# Patient Record
Sex: Male | Born: 1960 | Race: White | Hispanic: No | Marital: Married | Smoking: Never smoker
Health system: Southern US, Community
[De-identification: ages and names within clinical notes are randomized; demographics above are authoritative.]

## PROBLEM LIST (undated history)

## (undated) DIAGNOSIS — K859 Acute pancreatitis without necrosis or infection, unspecified: Secondary | ICD-10-CM

## (undated) HISTORY — DX: Acute pancreatitis without necrosis or infection, unspecified: K85.90

---

## 2016-08-14 DIAGNOSIS — I10 Essential (primary) hypertension: Secondary | ICD-10-CM | POA: Diagnosis not present

## 2016-08-14 DIAGNOSIS — M109 Gout, unspecified: Secondary | ICD-10-CM | POA: Diagnosis not present

## 2016-09-13 DIAGNOSIS — I10 Essential (primary) hypertension: Secondary | ICD-10-CM | POA: Diagnosis not present

## 2016-09-13 DIAGNOSIS — M1A9XX0 Chronic gout, unspecified, without tophus (tophi): Secondary | ICD-10-CM | POA: Diagnosis not present

## 2016-12-19 DIAGNOSIS — I10 Essential (primary) hypertension: Secondary | ICD-10-CM | POA: Diagnosis not present

## 2016-12-19 DIAGNOSIS — Z23 Encounter for immunization: Secondary | ICD-10-CM | POA: Diagnosis not present

## 2017-03-20 DIAGNOSIS — I10 Essential (primary) hypertension: Secondary | ICD-10-CM | POA: Diagnosis not present

## 2017-03-20 DIAGNOSIS — Z23 Encounter for immunization: Secondary | ICD-10-CM | POA: Diagnosis not present

## 2017-03-20 DIAGNOSIS — E785 Hyperlipidemia, unspecified: Secondary | ICD-10-CM | POA: Diagnosis not present

## 2017-06-25 DIAGNOSIS — I10 Essential (primary) hypertension: Secondary | ICD-10-CM | POA: Diagnosis not present

## 2017-06-25 DIAGNOSIS — E785 Hyperlipidemia, unspecified: Secondary | ICD-10-CM | POA: Diagnosis not present

## 2017-09-02 DIAGNOSIS — R21 Rash and other nonspecific skin eruption: Secondary | ICD-10-CM | POA: Diagnosis not present

## 2017-09-24 DIAGNOSIS — E785 Hyperlipidemia, unspecified: Secondary | ICD-10-CM | POA: Diagnosis not present

## 2017-09-24 DIAGNOSIS — M109 Gout, unspecified: Secondary | ICD-10-CM | POA: Diagnosis not present

## 2017-09-24 DIAGNOSIS — I10 Essential (primary) hypertension: Secondary | ICD-10-CM | POA: Diagnosis not present

## 2017-12-25 DIAGNOSIS — M109 Gout, unspecified: Secondary | ICD-10-CM | POA: Diagnosis not present

## 2017-12-25 DIAGNOSIS — E785 Hyperlipidemia, unspecified: Secondary | ICD-10-CM | POA: Diagnosis not present

## 2017-12-25 DIAGNOSIS — I10 Essential (primary) hypertension: Secondary | ICD-10-CM | POA: Diagnosis not present

## 2021-02-06 ENCOUNTER — Encounter: Payer: Self-pay | Admitting: Internal Medicine

## 2021-02-06 ENCOUNTER — Ambulatory Visit: Payer: No Typology Code available for payment source | Admitting: Internal Medicine

## 2021-02-06 VITALS — BP 120/80 | HR 59 | Ht 70.5 in | Wt 180.0 lb

## 2021-02-06 DIAGNOSIS — R935 Abnormal findings on diagnostic imaging of other abdominal regions, including retroperitoneum: Secondary | ICD-10-CM

## 2021-02-06 DIAGNOSIS — K863 Pseudocyst of pancreas: Secondary | ICD-10-CM | POA: Diagnosis not present

## 2021-02-06 DIAGNOSIS — K859 Acute pancreatitis without necrosis or infection, unspecified: Secondary | ICD-10-CM

## 2021-02-06 DIAGNOSIS — K8020 Calculus of gallbladder without cholecystitis without obstruction: Secondary | ICD-10-CM

## 2021-02-06 NOTE — Patient Instructions (Signed)
If you are age 59 or older, your body mass index should be between 23-30. Your Body mass index is 25.46 kg/m. If this is out of the aforementioned range listed, please consider follow up with your Primary Care Provider.  If you are age 30 or younger, your body mass index should be between 19-25. Your Body mass index is 25.46 kg/m. If this is out of the aformentioned range listed, please consider follow up with your Primary Care Provider.   ________________________________________________________  The [facility] providers would like to encourage you to use [facility] to communicate with providers for non-urgent requests or questions.  Due to long hold times on the telephone, sending your provider a message by [facility] may be a faster and more efficient way to get a response.  Please allow 48 business hours for a response.  Please remember that this is for non-urgent requests.  _______________________________________________________  Bonita Quin have been scheduled for an MRI/MRCP at [facility] on 03/08/2021. Your appointment time is 9:00am. Please arrive to admitting (at main entrance of the hospital) 30 minutes prior to your appointment time for registration purposes. Please make certain not to have anything to eat or drink 4 hours prior to your test. In addition, if you have any metal in your body, have a pacemaker or defibrillator, please be sure to let your ordering physician know. This test typically takes 45 minutes to 1 hour to complete. Should you need to reschedule, please call [phone] to do so.

## 2021-02-06 NOTE — Progress Notes (Signed)
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 Marvis Moeller, regarding pancreatic pseudocyst.  The patient is accompanied by his wife.  Patient reports 3-year history of intermittent problems with abdominal pain consistent with biliary colic.  On December 14, 2020, while working in [place], the patient developed severe persistent epigastric pain followed by nausea and vomiting.  He presented to [facility] and was found to have acute pancreatitis with lipase level greater than 15,000.  Liver test revealed mild elevation of transaminases.  Normal alkaline phosphatase and bilirubin.  Admission hemoglobin 16.8.  Discharge hemoglobin 12.9.  Admission BUN 29.  Discharge BUN 17.  CT scan on admission revealed changes of acute pancreatitis.  Gallstones noted.    Follow-up CT scan December 19, 2020: CT Abdomen and Pelvis With IV Contrast  Result Date: 12/19/2020 IMPRESSION: 1. Diffuse heterogeneous enhancement of the pancreas on the current exam, with the suggestion of more focal ill-defined regions of hypoenhancement in the region of the pancreatic neck and body, as well as the anterior aspect of the pancreatic head, suspicious for possible regions of pancreatic parenchymal necrosis. There remains extensive peripancreatic fat stranding/edema and fluid, consistent with the patient's history of acute pancreatitis. There appears to be a small more focal oblong loculated fluid collection extending along the undersurface of the gastric wall on the current exam, suspicious for the possibility of a developing pseudocyst or abscess. There also appears to be relatively increased additional partially loculated fluid in the lesser sac anterior to the pancreatic head and neck on the current exam. 2. Relatively increased free fluid in the abdomen and pelvis, including a moderate volume of increased free fluid in the pelvis, as noted above. In  addition, there is also mild diffuse mesenteric edema and subcutaneous edema/anasarca, which is relatively increased compared to the previous exam. 3. New small bilateral pleural effusions, with patchy regions of consolidation throughout the lung bases, with associated air bronchograms, indeterminate for atelectasis versus airspace disease/infection. 4. Persistent large rim calcified gallstone is again noted in the lumen of the gallbladder, as noted above. There appears to be relatively increased thickening and enhancement of the gallbladder wall on the current exam, with adjacent pericholecystic fat stranding/edema. While its possible this may be reactive in etiology, related to the patient's aforementioned acute pancreatitis, the possibility of superimposed acute or chronic cholecystitis cannot be excluded on this examination. Clinical correlation is recommended. 5. Diffuse thickening of the esophageal wall on the current exam, which is nonspecific, possibly related to a component of underlying acute esophagitis. Dedicated upper endoscopy may be useful for further evaluation, as clinically warranted. 6. Persistent left inguinal hernia, containing fat and a small portion of the sigmoid colon, as noted above  The patient was discharged home December 20, 2020.  He is accompanied today by his wife.  During his hospitalization he did have atrial fibrillation.  He was treated medically.  This resolved.  Since his hospital discharge he has been feeling fine.  No abdominal pain.  No vomiting.  No fevers.  He was evaluated by Dr. Lequita Halt for the prospects of cholecystectomy.  Office evaluation December 11, 2020.  Reviewed.  He was subsequently set up for a CT scan of the abdomen and pelvis.  He was noted to have gallstones.  No biliary dilation.  There was a 7.3 cm bilobed cystic process adjacent to the anterior aspect of the pancreatic body.  There was ductal dilation  in the head without discrete mass.  At the  request of his workplace, he is asked me to fill out short-term disability.  REVIEW OF SYSTEMS:  All non-GI ROS negative entirely.  Past Medical History:  Diagnosis Date   Pancreatitis     History reviewed. No pertinent surgical history.  Social History [name]  reports that he has never smoked. He has never used smokeless tobacco. He reports that he does not drink alcohol and does not use drugs.  Family History  Heart disease  No Known Allergies     PHYSICAL EXAMINATION: Vital signs: BP 120/80   Pulse (!) 59   Ht 5' 10.5" (1.791 m)   Wt 180 lb (81.6 kg)   BMI 25.46 kg/m   Constitutional: generally well-appearing, no acute distress Psychiatric: alert and oriented x3, cooperative Eyes: extraocular movements intact, anicteric, conjunctiva pink Mouth: oral pharynx moist, no lesions Neck: supple no lymphadenopathy Cardiovascular: heart regular rate and rhythm, no murmur Lungs: clear to auscultation bilaterally Abdomen: soft, nontender, nondistended, no obvious ascites, no peritoneal signs, normal bowel sounds, no organomegaly Rectal: Omitted Extremities: no clubbing, cyanosis, or lower extremity edema bilaterally Skin: no lesions on visible extremities Neuro: No focal deficits.  Cranial nerves intact  ASSESSMENT:  1.  Acute pancreatitis.  Hospitalized in [facility] December 14, 2020 through December 20, 2020.  Suspect biliary in origin. 2.  Cholelithiasis. 3.  Minimal elevation of transaminases during hospitalization with normal alkaline phosphatase and bilirubin.  No ductal dilation on imaging. 4.  Organizing fluid collection.  Asymptomatic.  Patient may have had an element of necrosis based on previous imaging. 5.  General medical problems.  Stable   PLAN:  1.  At this point, I would follow his organizing fluid collection as he is currently asymptomatic.  We will plan on MRI/MRCP in 1 month.  At that point, hopefully all inflammatory changes will have resolved.   We should be able to evaluate the bile duct and pancreatic duct.  We can assess for mass.  As well, reassess the fluid collection.  May or may not need EUS. 2.  Laparoscopic cholecystectomy.  Timing to be determined 3.  Continue with low-fat diet 4.  Contact the office in the interim for any problems 5.  Filling out his short-term disability papers as requested.  A total time of 60+ minutes was spent preparing to see the patient, reviewing outside hospitalization, laboratories, and multiple x-rays.  Obtaining comprehensive history, performing comprehensive physical examination, counseling the patient and his wife regarding his condition and the plans.  Ordering advanced imaging studies, filling out disability papers, and documenting clinical information in the health record

## 2021-02-12 ENCOUNTER — Telehealth: Payer: Self-pay | Admitting: Internal Medicine

## 2021-02-12 NOTE — Telephone Encounter (Signed)
Inbound call from Patient, wanting to follow up on FMLA paperwork.

## 2021-02-13 NOTE — Telephone Encounter (Signed)
Lm on vm that Dr. Marina Goodell had filled out his part and signed the paperwork and that I faxed it and was awaiting a response

## 2021-03-06 NOTE — Telephone Encounter (Signed)
Awaiting response

## 2021-03-08 ENCOUNTER — Other Ambulatory Visit: Payer: Self-pay

## 2021-03-08 ENCOUNTER — Ambulatory Visit (HOSPITAL_COMMUNITY)
Admission: RE | Admit: 2021-03-08 | Discharge: 2021-03-08 | Disposition: A | Discharge status: Home / Self Care | Payer: PRIVATE HEALTH INSURANCE | Source: Ambulatory Visit | Attending: Internal Medicine | Admitting: Internal Medicine

## 2021-03-08 ENCOUNTER — Other Ambulatory Visit: Payer: Self-pay | Admitting: Internal Medicine

## 2021-03-08 ENCOUNTER — Ambulatory Visit (HOSPITAL_COMMUNITY): Payer: No Typology Code available for payment source

## 2021-03-08 DIAGNOSIS — K859 Acute pancreatitis without necrosis or infection, unspecified: Secondary | ICD-10-CM | POA: Insufficient documentation

## 2021-03-08 IMAGING — MR MR ABDOMEN WO/W CM MRCP
16 of 20 series · 36 of 48 positions shown · IV contrast (gadavist)
Comparison: No prior abdominal MRI. CT of the abdomen and pelvis
[DATE].

CLINICAL DATA: 60-year-old male with history of acute pancreatitis.
Evaluate for pancreatic pseudocyst.

EXAM:
MRI ABDOMEN WITHOUT AND WITH CONTRAST (INCLUDING MRCP)
TECHNIQUE: Multiplanar multisequence MR imaging of the abdomen was performed
both before and after the administration of intravenous contrast.
Heavily T2-weighted images of the biliary and pancreatic ducts were
obtained, and three-dimensional MRCP images were rendered by post
processing.
CONTRAST:  8mL GADAVIST GADOBUTROL 1 MMOL/ML IV SOLN

[Series 3: T2 fat-sat · axial · 6.0mm · 1.25mm/px · 1 of 36 slices shown]
[im 1/36]
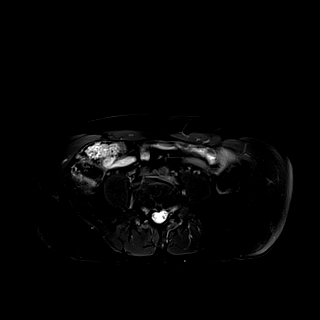

[Series 5: DWI · axial · 6.0mm · 1.49mm/px · z∈[-63,+189]mm · 2 of 72 slices shown (1 of 2)]
[im 1/72]
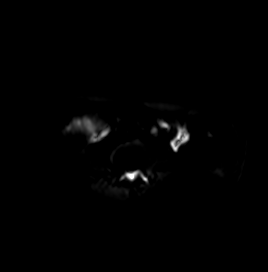
[im 72/72]
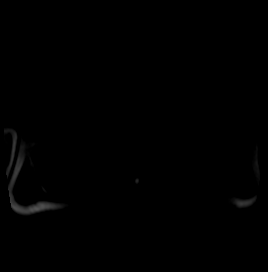

[Series 6: DWI · axial · 6.0mm · 1.49mm/px · 1 of 36 slices shown (2 of 2)]
[im 1/36]
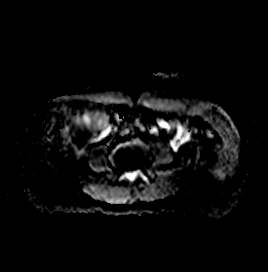

[Series 8: cor_3d_spc_trig · coronal · 1.0mm · 0.49mm/px · 3 of 88 slices shown]
[im 1/88]
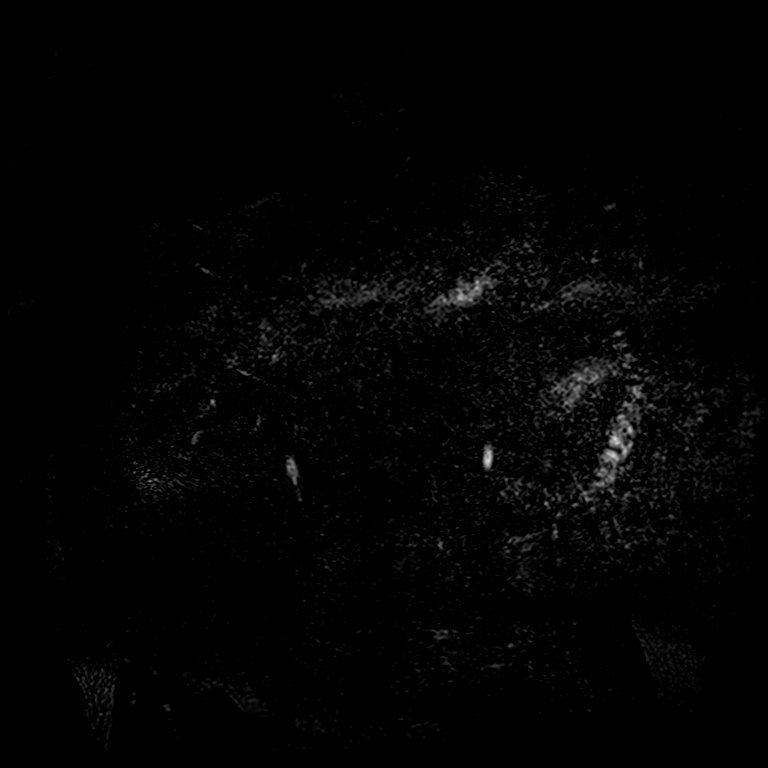
[im 44/88]
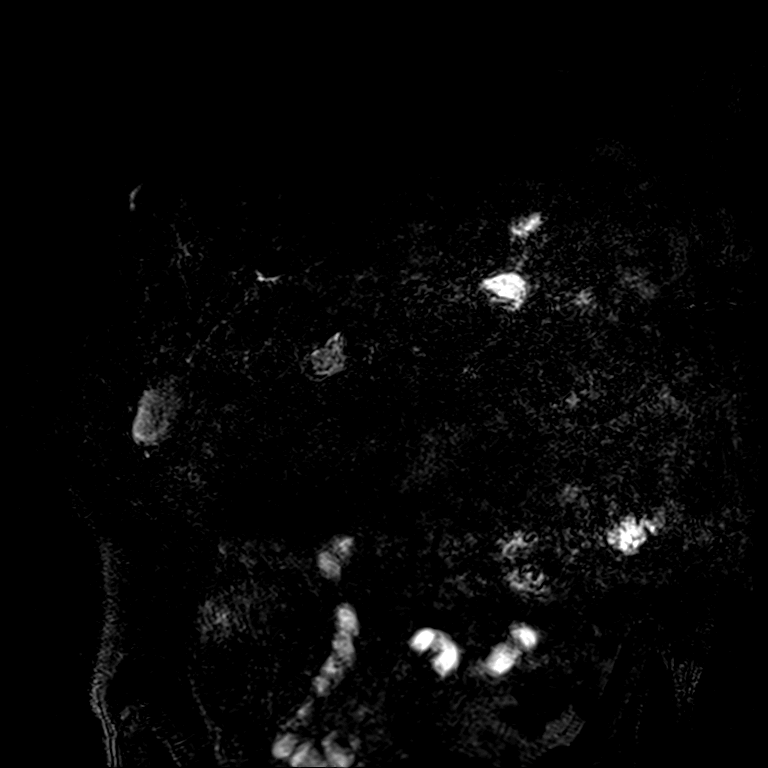
[im 88/88]
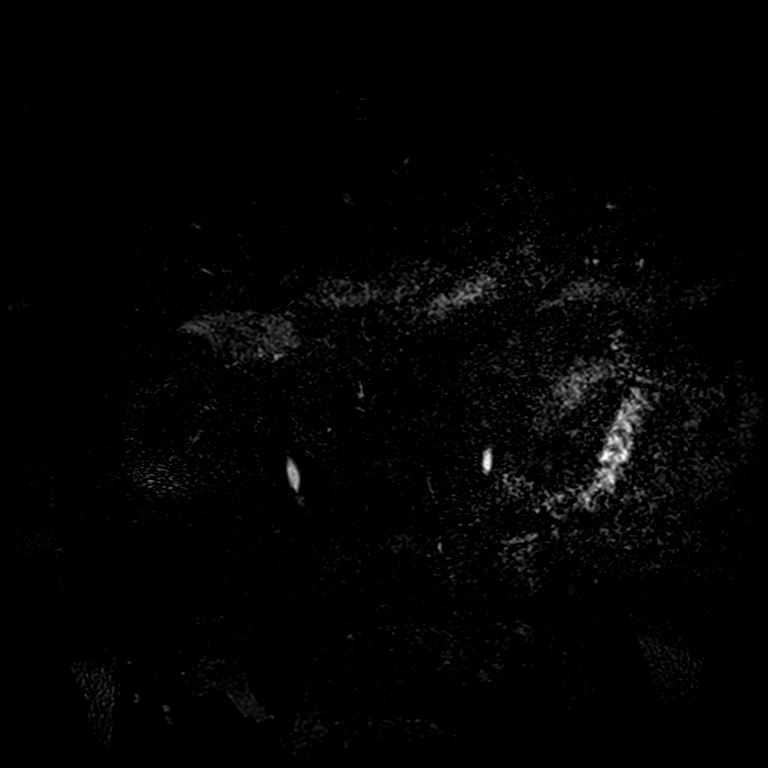

[Series 10: T2 · coronal · 6.0mm · 1.52mm/px · 1 of 28 slices shown (1 of 2)]
[im 1/28]
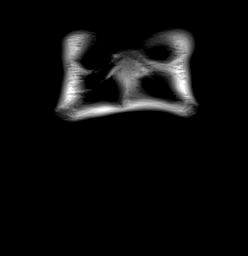

[Series 11: T1 · axial · 3.0mm · 1.25mm/px · z∈[-50,+211]mm · 3 of 88 slices shown (1 of 2)]
[im 1/88]
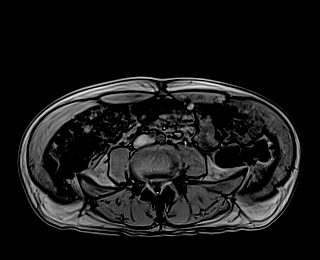
[im 44/88]
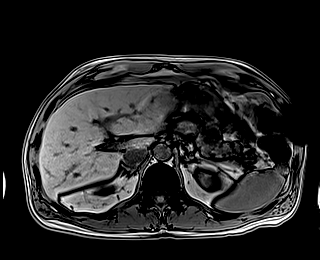
[im 88/88]
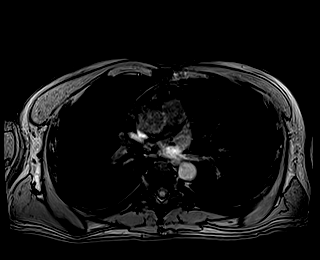

[Series 12: T1 · axial · 3.0mm · 1.25mm/px · z∈[-50,+211]mm · 3 of 88 slices shown (2 of 2)]
[im 1/88]
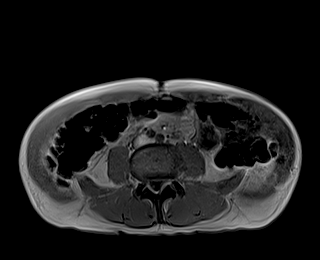
[im 44/88]
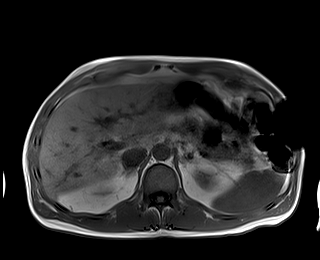
[im 88/88]
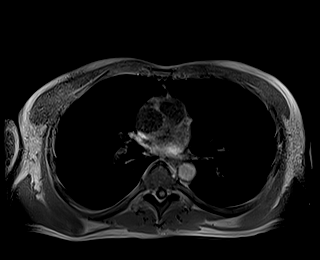

[Series 14: cor obl thk · sagittal · 50.0mm · 0.78mm/px · 1 of 9 slices shown]
[im 1/9]
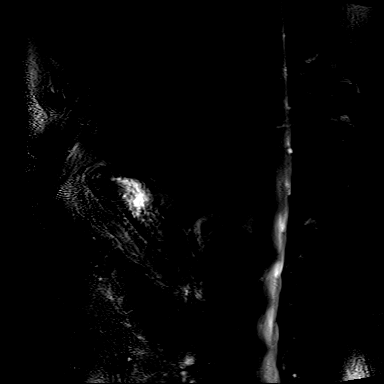

[Series 15: T2 · axial · 6.0mm · 1.56mm/px · 1 of 34 slices shown (2 of 2)]
[im 1/34]
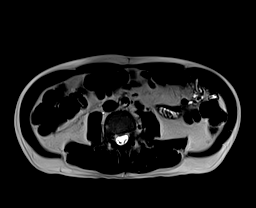

[Series 17: T1 dynamic · axial · 3.0mm · 1.25mm/px · z∈[-25,+212]mm · 3 of 80 slices shown (1 of 7)]
[im 1/80]
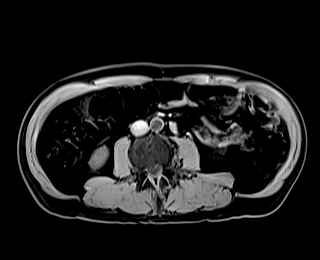
[im 40/80]
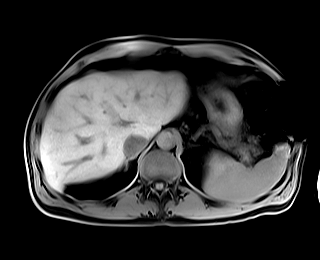
[im 80/80]
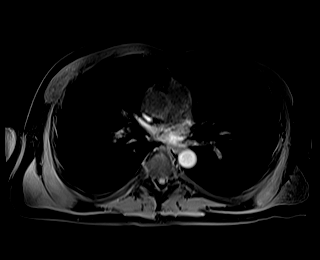

[Series 21: T1 dynamic · axial · 3.0mm · 1.25mm/px · z∈[-25,+212]mm · 3 of 80 slices shown (2 of 7)]
[im 1/80]
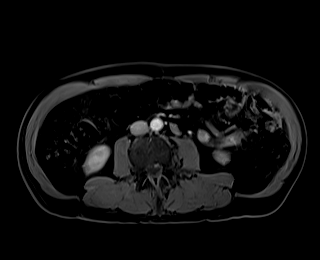
[im 40/80]
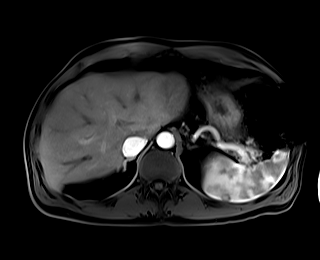
[im 80/80]
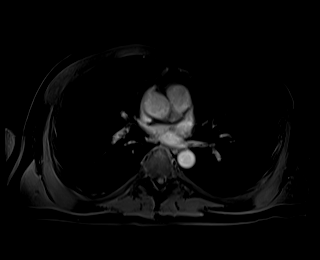

[Series 22: T1 dynamic · axial · 3.0mm · 1.25mm/px · z∈[-25,+212]mm · 3 of 80 slices shown (3 of 7)]
[im 1/80]
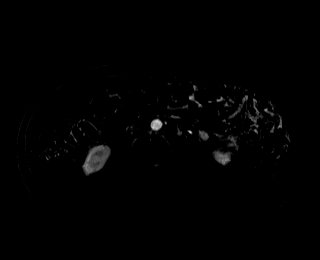
[im 40/80]
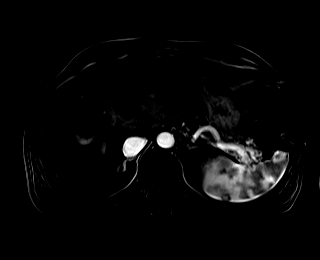
[im 80/80]
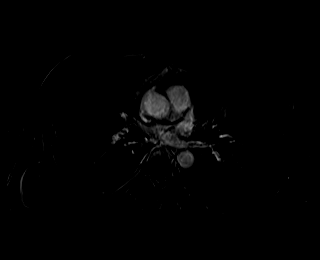

[Series 25: T1 dynamic · axial · 3.0mm · 1.25mm/px · z∈[-25,+212]mm · 3 of 80 slices shown (4 of 7)]
[im 1/80]
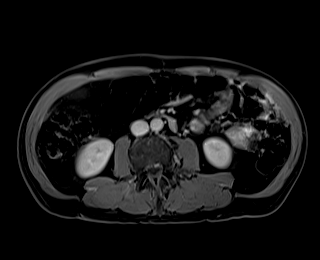
[im 40/80]
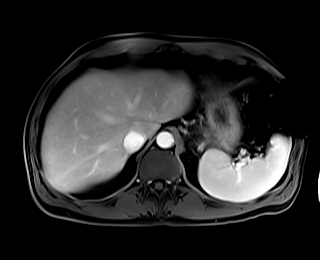
[im 80/80]
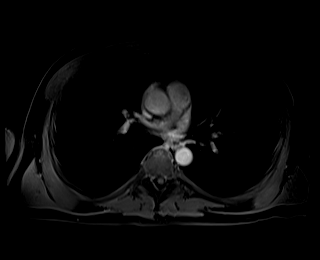

[Series 26: T1 dynamic · axial · 3.0mm · 1.25mm/px · z∈[-25,+212]mm · 3 of 80 slices shown (5 of 7)]
[im 1/80]
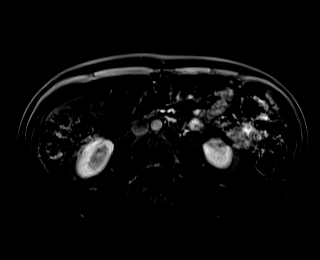
[im 40/80]
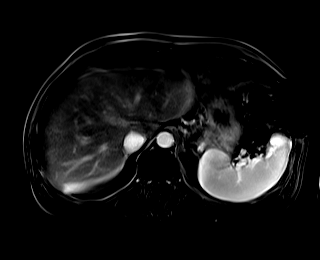
[im 80/80]
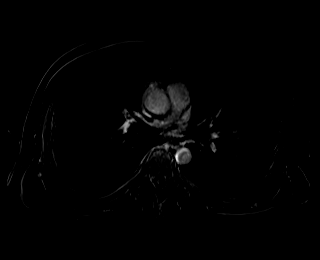

[Series 29: T1 dynamic · axial · 3.0mm · 1.25mm/px · z∈[-25,+212]mm · 3 of 80 slices shown (6 of 7)]
[im 1/80]
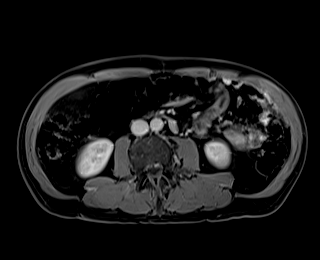
[im 40/80]
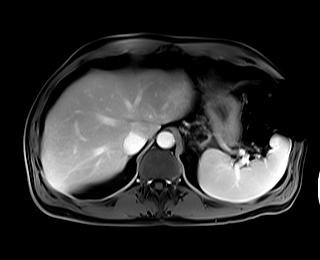
[im 80/80]
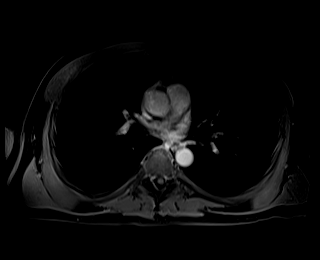

[Series 30: T1 dynamic · axial · 3.0mm · 1.25mm/px · z∈[-25,+92]mm · 2 of 80 slices shown (7 of 7)]
[im 1/80]
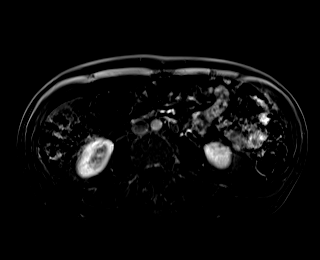
[im 40/80]
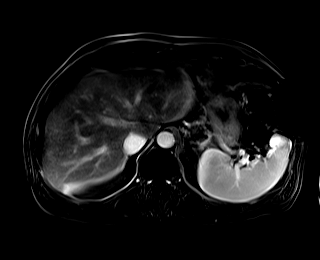

[36 of 48 positions shown; findings below may reference images not displayed]

FINDINGS: Lower chest: Unremarkable.

Hepatobiliary: No suspicious cystic or solid hepatic lesions. No
intra or extrahepatic biliary ductal dilatation noted on MRCP
images. Common bile duct is 4 mm in the porta hepatis. There are no
filling defects within the common bile duct to suggest the presence
of choledocholithiasis. Large filling defect within the gallbladder,
compatible with a gallstone measuring up to 4.7 cm in diameter. This
is non dependent, and appear to contain some gas on the prior CT the
abdomen and pelvis.

Pancreas: In the body of the pancreas there is a large complex
collection which is irregular in shape and therefore difficult to
accurately measure, but estimated to measure approximately 2.6 x
x 2.2 cm (axial image 49 of series 21 and coronal image 13 of series
10). This lesion is heterogeneous in signal intensity on T1 and T2
weighted images, but generally T1 hypointense and T2 hyperintense,
with peripheral rim of enhancement but no definite internal
enhancing soft tissue. No definite solid enhancing pancreatic mass
noted. No peripancreatic inflammatory changes identified at this
time. Pancreatic parenchyma appears to enhance normally.

Spleen: Spleen is prominent and borderline measuring 12.5 x 4.5 x
9.7 cm (estimated splenic volume of 272 mL).

Adrenals/Urinary Tract: Bilateral kidneys and adrenal glands are
normal in appearance. No hydroureteronephrosis in the visualized
portions of the abdomen.

Stomach/Bowel: Visualized portions are unremarkable.

Vascular/Lymphatic: No aneurysm identified in the visualized
abdominal vasculature. No lymphadenopathy noted in the abdomen.

Other: No significant volume of ascites noted in the visualized
portions of the peritoneal cavity.

Musculoskeletal: No aggressive appearing osseous lesions are noted
in the visualized portions of the skeleton.
IMPRESSION: 1. Slight involution of chronic pancreatic pseudocyst in the body of
the pancreas, as above.
2. Large gas containing gallstone within the gallbladder. No
findings to suggest an acute cholecystitis are noted at this time.
3. No choledocholithiasis or findings of biliary tract obstruction.

## 2021-03-08 MED ORDER — GADOBUTROL 1 MMOL/ML IV SOLN
8.0000 mL | Freq: Once | INTRAVENOUS | Status: AC | PRN
  Administered 2021-03-08: 8 mL via INTRAVENOUS

## 2021-03-12 ENCOUNTER — Telehealth: Payer: Self-pay | Admitting: Internal Medicine

## 2021-03-12 NOTE — Telephone Encounter (Signed)
Returned patient call. See result note from 03/09/21 MRI report.

## 2021-03-12 NOTE — Telephone Encounter (Signed)
Patient returned your call regarding the results of his recent MRI.  Please call him back.  Thank you.

## 2021-04-18 ENCOUNTER — Ambulatory Visit: Payer: No Typology Code available for payment source | Admitting: Internal Medicine

## 2021-04-18 ENCOUNTER — Encounter: Payer: Self-pay | Admitting: Internal Medicine

## 2021-04-18 VITALS — BP 120/80 | HR 68 | Ht 71.0 in | Wt 185.1 lb

## 2021-04-18 DIAGNOSIS — K859 Acute pancreatitis without necrosis or infection, unspecified: Secondary | ICD-10-CM

## 2021-04-18 DIAGNOSIS — R935 Abnormal findings on diagnostic imaging of other abdominal regions, including retroperitoneum: Secondary | ICD-10-CM | POA: Diagnosis not present

## 2021-04-18 DIAGNOSIS — K8020 Calculus of gallbladder without cholecystitis without obstruction: Secondary | ICD-10-CM | POA: Diagnosis not present

## 2021-04-18 DIAGNOSIS — K863 Pseudocyst of pancreas: Secondary | ICD-10-CM

## 2021-04-18 NOTE — Patient Instructions (Signed)
If you are age 59 or older, your body mass index should be between 23-30. Your Body mass index is 25.82 kg/m. If this is out of the aforementioned range listed, please consider follow up with your Primary Care Provider.  If you are age 17 or younger, your body mass index should be between 19-25. Your Body mass index is 25.82 kg/m. If this is out of the aformentioned range listed, please consider follow up with your Primary Care Provider.   ________________________________________________________  The [facility] providers would like to encourage you to use [facility] to communicate with providers for non-urgent requests or questions.  Due to long hold times on the telephone, sending your provider a message by [facility] may be a faster and more efficient way to get a response.  Please allow 48 business hours for a response.  Please remember that this is for non-urgent requests.  _______________________________________________________  David Schaefer will be contacted by [facility] Scheduling in the next 2 days to arrange an MRI of the abdomen.  The number on your caller ID will be [phone], please answer when they call.  If you have not heard from them in 2 days please call [phone] to schedule.

## 2021-04-18 NOTE — Progress Notes (Signed)
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  He was sent regarding mild elevation of liver test, organizing fluid collection in the pancreas (asymptomatic), and concerns over possible choledocholithiasis prior to consideration of cholecystectomy.  See that dictation for details.  An MRI of the abdomen/MRCP was subsequently performed March 08, 2021.  There was no evidence for biliary ductal dilation or choledocholithiasis.  Cholelithiasis noted.  In the body of the pancreas a pseudocyst measuring 2.6 x 6.0 x 2.2 cm was noted no active inflammatory changes.  Pancreatic parenchyma appeared otherwise normal.  He was cleared for cholecystectomy which was performed March 23, 2021.  I cannot find an operative report.  The patient reports that this was unremarkable.  He did have some postoperative discomfort at the operative sites which continues to improve.  He has returned to work.  He is eating normally.  He denies abdominal pain or other GI complaints.  He has gained 5 pounds since his last visit.  REVIEW OF SYSTEMS:  All non-GI ROS negative. Past Medical History:  Diagnosis Date   Pancreatitis     Past Surgical History:  Procedure Laterality Date   CHOLECYSTECTOMY     TRACHELECTOMY      Social History David Schaefer  reports that he has never smoked. He has never used smokeless tobacco. He reports that he does not drink alcohol and does not use drugs.  family history is not on file.  No Known Allergies     PHYSICAL EXAMINATION: Vital signs: BP 120/80    Pulse 68    Ht 5\' 11"  (1.803 m)    Wt 185 lb 2 oz (84 kg)    BMI 25.82 kg/m   Constitutional: generally well-appearing, no acute distress Psychiatric: alert and oriented x3, cooperative Eyes:  extraocular movements intact, anicteric, conjunctiva pink, poor dentition Mouth: oral pharynx moist, no lesions Neck: supple no lymphadenopathy Cardiovascular: heart regular rate and rhythm, no murmur Lungs: clear to auscultation bilaterally Abdomen: soft, nontender, nondistended, no obvious ascites, no peritoneal signs, normal bowel sounds, no organomegaly Rectal: Omitted Extremities: no clubbing, cyanosis, or lower extremity edema bilaterally Skin: no lesions on visible extremities Neuro: No focal deficits.  Cranial nerves intact  ASSESSMENT:  1.  Acute pancreatitis, presumed biliary, for which he was hospitalized in for 1 week September 2022 2.  Pancreatic pseudocyst.  Stable.  Asymptomatic 3.  Status postcholecystectomy March 23, 2021   PLAN:  1.  Low-fat diet 2.  Schedule repeat MRI of the pancreas in 4 weeks to reassess the pseudocyst 3.  Assume he is doing well, routine office follow-up 6 months. A total time of 30 minutes was spent preparing to see the patient, reviewing test, obtaining history, performing physical examination, counseling the patient regarding the above listed issues, ordering advanced radiology, arranging clinical follow-up, and documenting clinical information in the health record

## 2021-04-25 ENCOUNTER — Ambulatory Visit (HOSPITAL_COMMUNITY): Payer: No Typology Code available for payment source

## 2021-04-28 ENCOUNTER — Ambulatory Visit (HOSPITAL_COMMUNITY)
Admission: RE | Admit: 2021-04-28 | Discharge: 2021-04-28 | Disposition: A | Discharge status: Home / Self Care | Payer: PRIVATE HEALTH INSURANCE | Source: Ambulatory Visit | Attending: Internal Medicine | Admitting: Internal Medicine

## 2021-04-28 ENCOUNTER — Other Ambulatory Visit: Payer: Self-pay | Admitting: Internal Medicine

## 2021-04-28 DIAGNOSIS — K863 Pseudocyst of pancreas: Secondary | ICD-10-CM

## 2021-04-28 IMAGING — MR MR 3D RECON AT SCANNER
1 series · 9 of 16 positions shown · IV contrast (gadavist)
Comparison: MRI [DATE] and CT [DATE]

CLINICAL DATA: Follow-up pancreatic pseudocyst

EXAM:
MRI ABDOMEN WITHOUT AND WITH CONTRAST
TECHNIQUE: Multiplanar multisequence MR imaging of the abdomen was performed
both before and after the administration of intravenous contrast.
CONTRAST:  8mL GADAVIST GADOBUTROL 1 MMOL/ML IV SOLN

[Series 100: <mip range> · sagittal · 0.49mm/px · 9 of 16 slices shown]
[im 2/16]
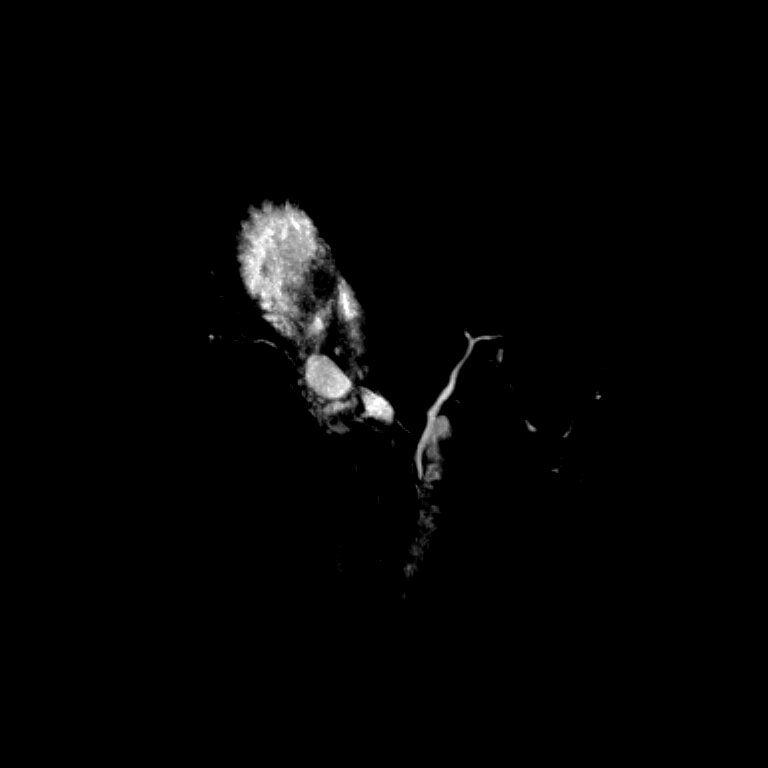
[im 3/16]
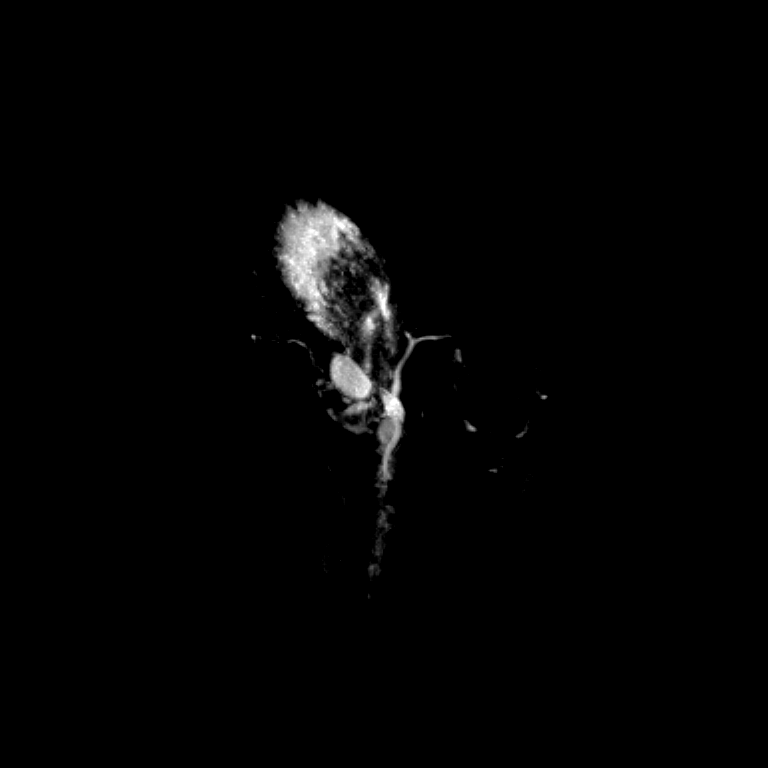
[im 4/16]
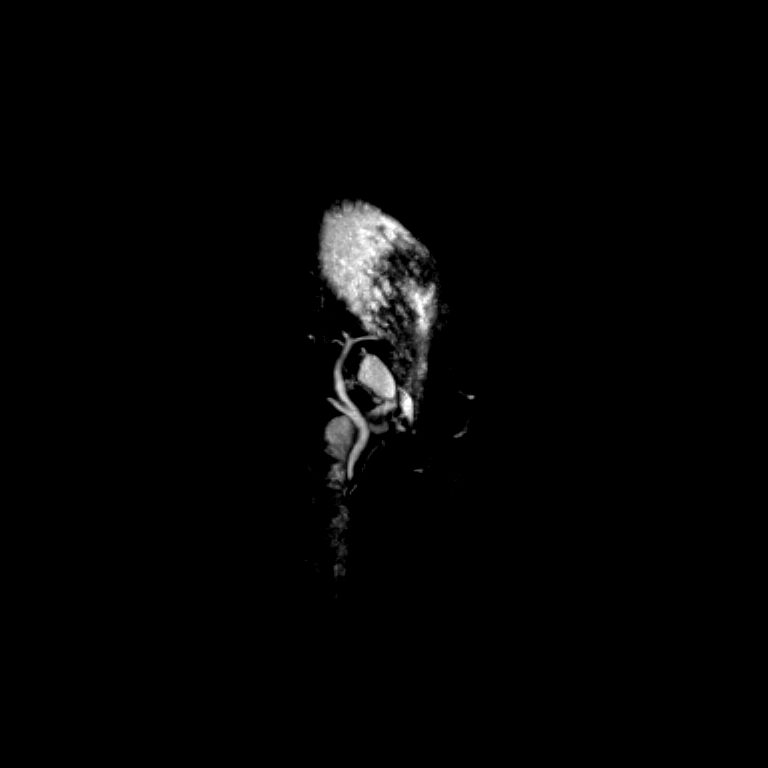
[im 6/16]
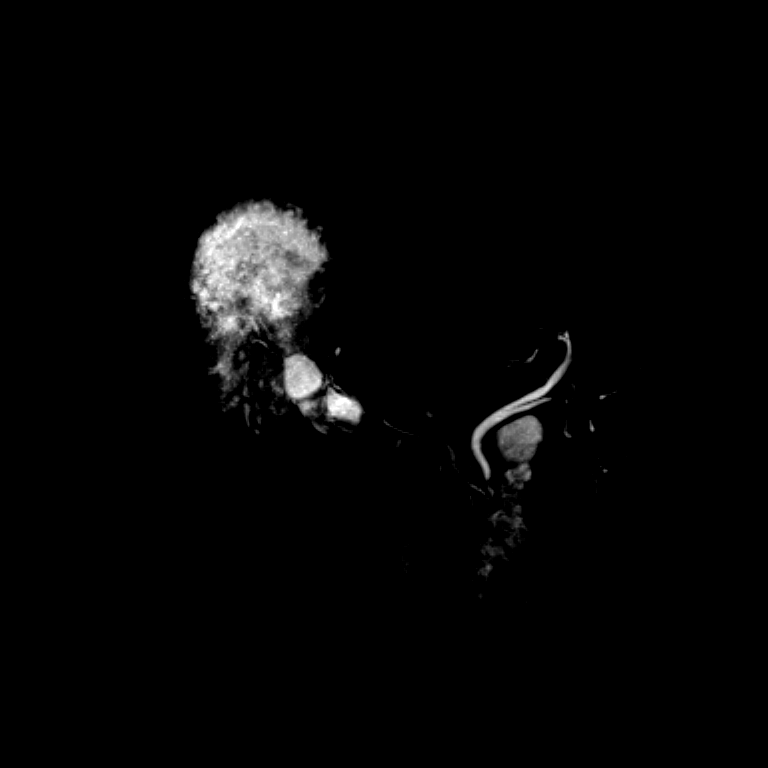
[im 8/16]
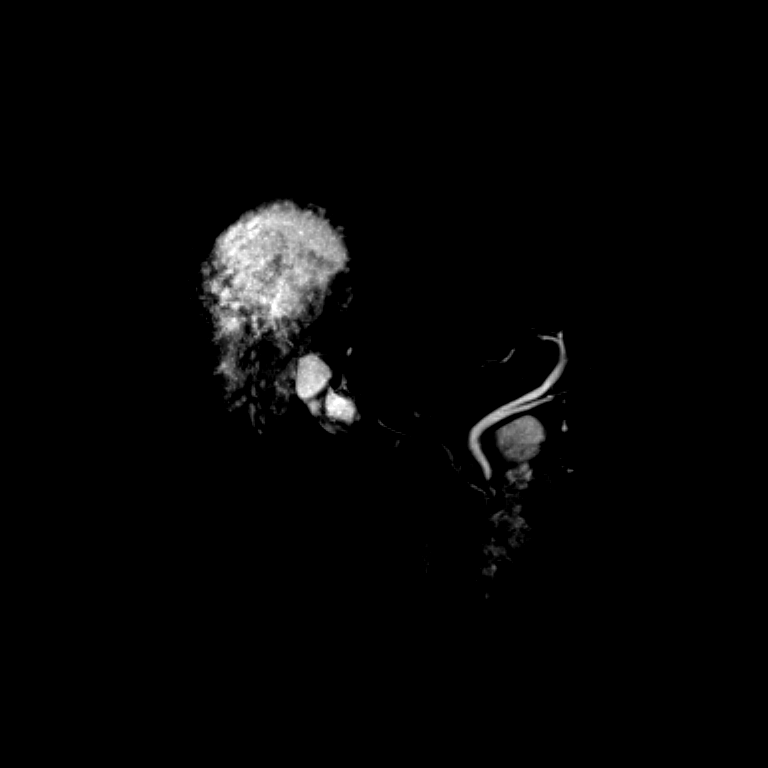
[im 9/16]
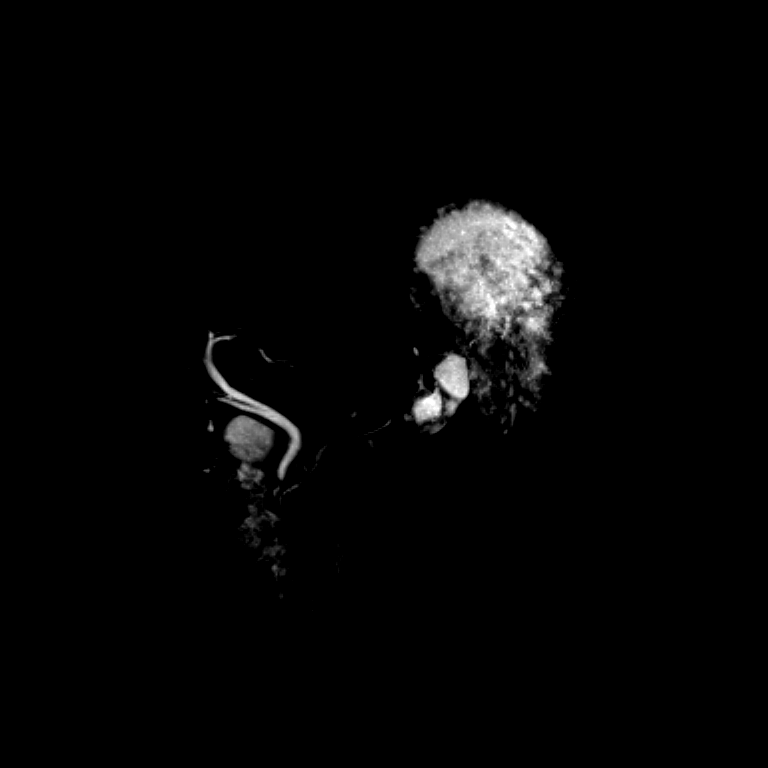
[im 10/16]
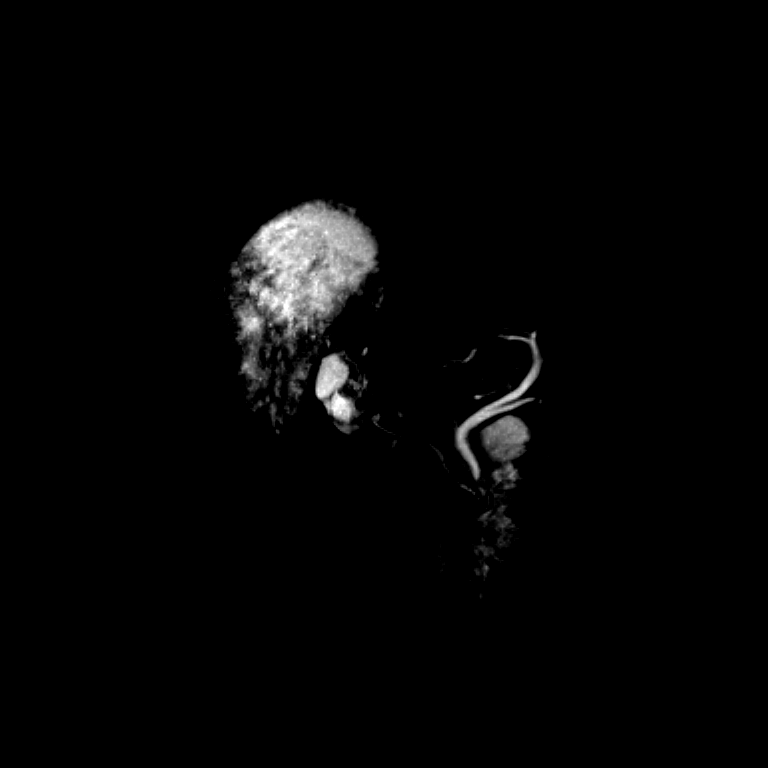
[im 12/16]
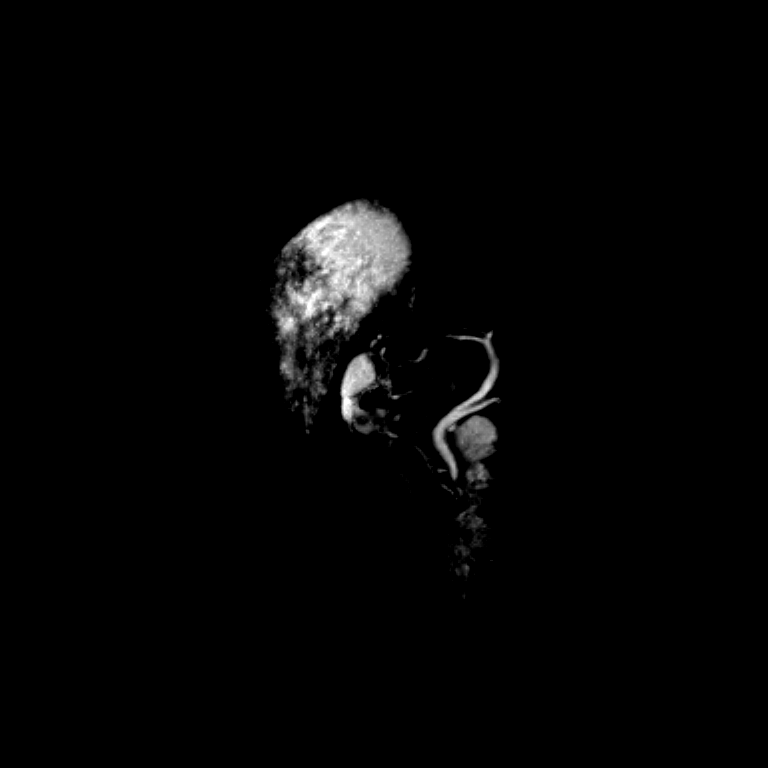
[im 14/16]
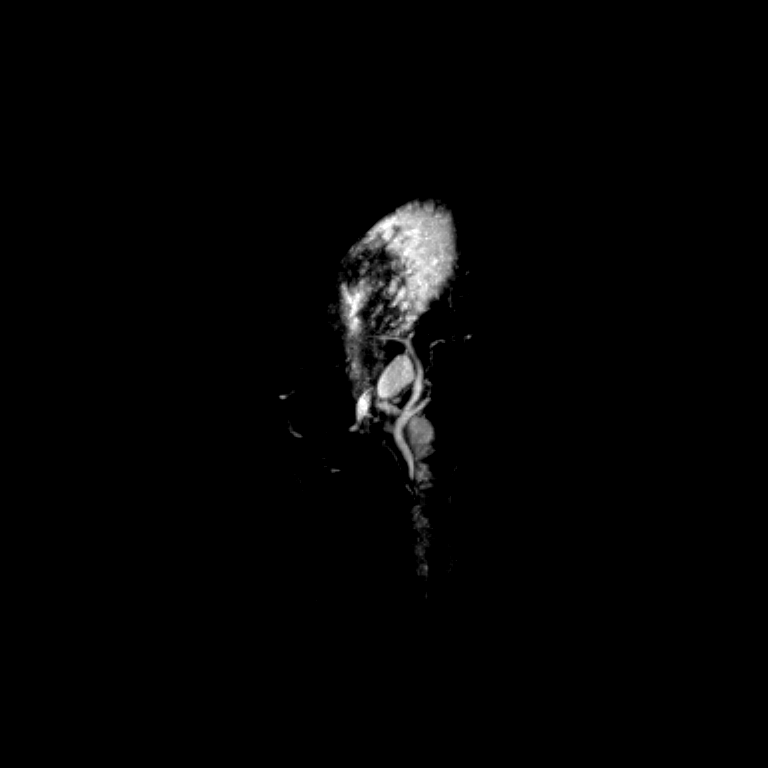

[9 of 16 positions shown; findings below may reference images not displayed]

FINDINGS: Lower chest: No acute findings.

Hepatobiliary: No hepatic steatosis. No suspicious hepatic lesion.
Gallbladder surgically absent. No biliary ductal dilation.

Pancreas: Irregularity shaped lobular large complex collection along
the anterior body of the pancreas is again difficult to accurately
measure but estimated to measure 4.9 x 2.1 x 1.6 cm on images [DATE]
and [DATE] previously 6.0 x 2.6 x 2.2 cm. This collection is once
again is predominantly T2 hyperintense and T1 hypointense without
suspicious postcontrast enhancement. Otherwise, homogeneous
enhancement of the pancreatic parenchyma. No pancreatic ductal
dilation. No evidence of acute inflammation.

Spleen: Similar borderline splenomegaly measuring 12.5 cm in maximum
dimension.

Adrenals/Urinary Tract: Bilateral adrenal glands appear normal. No
hydronephrosis. No solid enhancing renal mass.

Stomach/Bowel: Visualized portions within the abdomen are
unremarkable.

Vascular/Lymphatic: No pathologically enlarged lymph nodes
identified. No abdominal aortic aneurysm demonstrated.

Other:  None.

Musculoskeletal: No suspicious bone lesions identified.
IMPRESSION: 1. Decreased size of the complex pancreatic collection, which is not
demonstrate suspicious MRI features and is again favored to reflect
a chronic pancreatic pseudocyst. Recommend follow up pre and post
contrast MRI/MRCP 6 months versus fluid sampling by EUS/FNA. This
recommendation follows ACR consensus guidelines: Management of
Incidental Pancreatic Cysts: A White Paper of the ACR Incidental
Findings Committee. [HOSPITAL] [XI];[DATE].
2. No acute findings in the abdomen.

## 2021-04-28 IMAGING — MR MR ABDOMEN WO/W CM
16 of 21 series · 38 of 48 positions shown · IV contrast (gadavist)
Comparison: MRI [DATE] and CT [DATE]

CLINICAL DATA: Follow-up pancreatic pseudocyst

EXAM:
MRI ABDOMEN WITHOUT AND WITH CONTRAST
TECHNIQUE: Multiplanar multisequence MR imaging of the abdomen was performed
both before and after the administration of intravenous contrast.
CONTRAST:  8mL GADAVIST GADOBUTROL 1 MMOL/ML IV SOLN

[Series 2: DWI · axial · 6.0mm · 1.49mm/px · z∈[-121,+145]mm · 4 of 76 slices shown (1 of 2)]
[im 1/76]
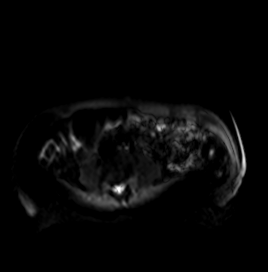
[im 26/76]
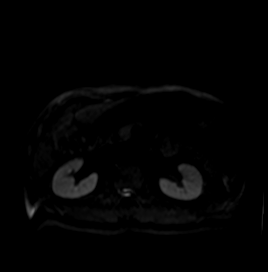
[im 51/76]
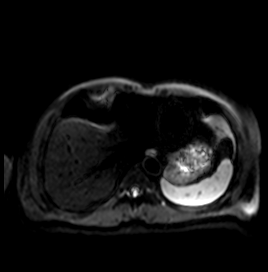
[im 76/76]
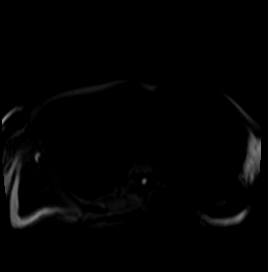

[Series 3: DWI · axial · 6.0mm · 1.49mm/px · 1 of 38 slices shown (2 of 2)]
[im 1/38]
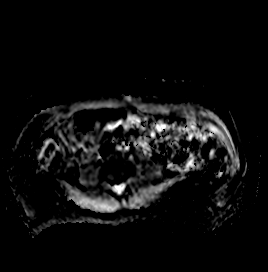

[Series 4: T2 fat-sat · axial · 6.0mm · 1.25mm/px · 1 of 39 slices shown]
[im 1/39]
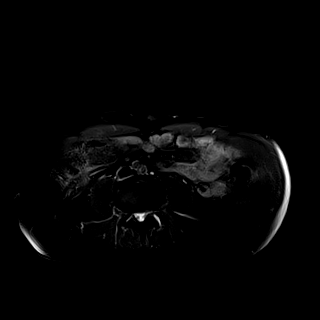

[Series 7: cor_3d_spc_trig · coronal · 1.0mm · 0.49mm/px · 3 of 72 slices shown]
[im 1/72]
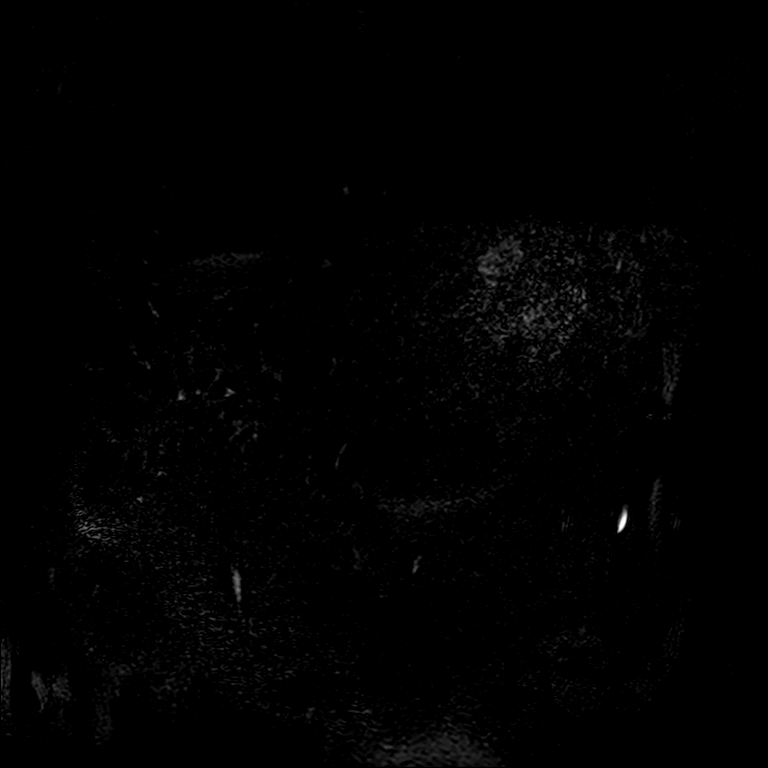
[im 36/72]
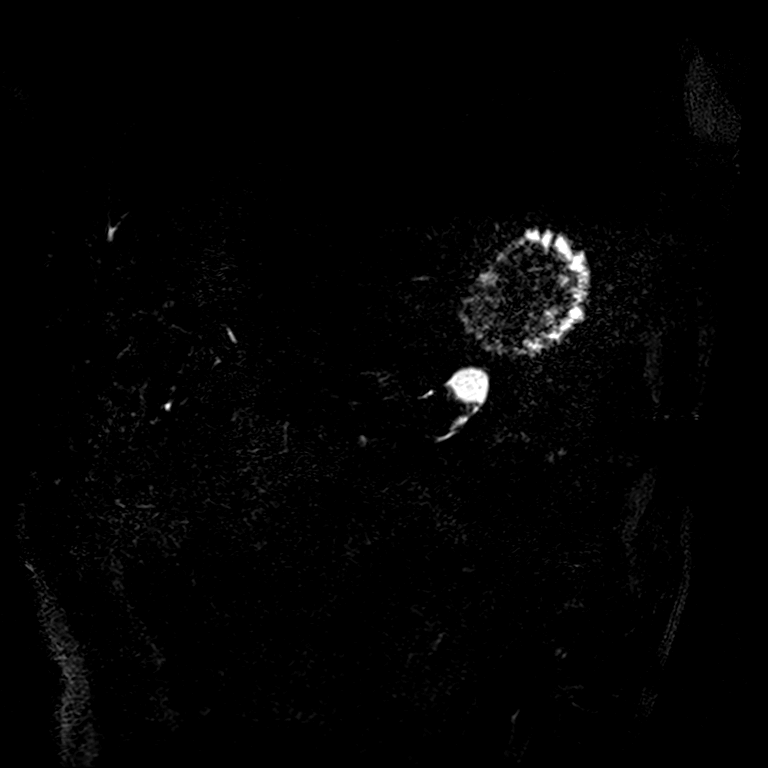
[im 72/72]
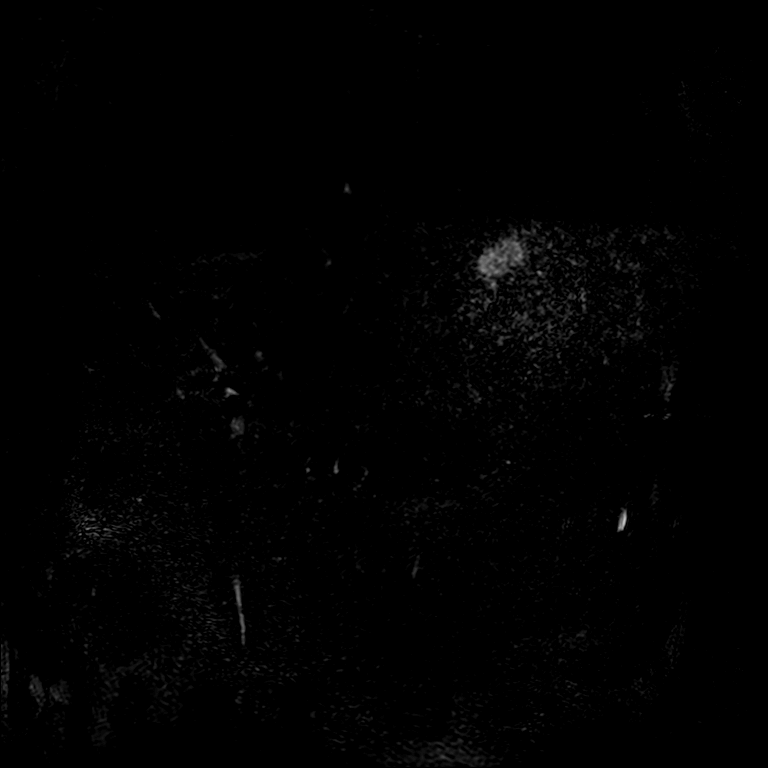

[Series 11: T2 · coronal · 7.0mm · 1.56mm/px · 1 of 37 slices shown]
[im 1/37]
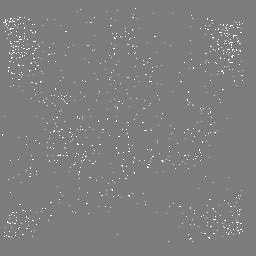

[Series 12: cor obl thk · sagittal · 50.0mm · 0.78mm/px · 1 of 9 slices shown]
[im 1/9]
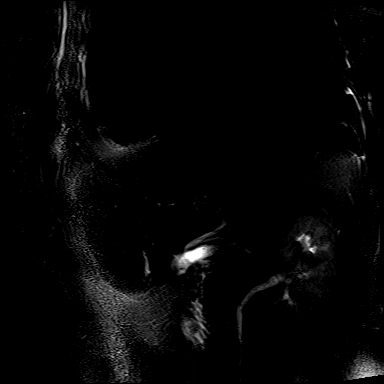

[Series 14: T1 · axial · 3.1mm · 1.25mm/px · z∈[-141,+129]mm · 3 of 88 slices shown (1 of 2)]
[im 1/88]
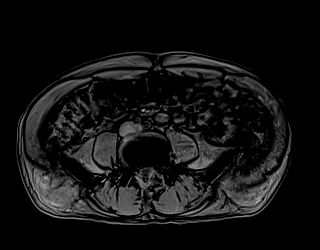
[im 44/88]
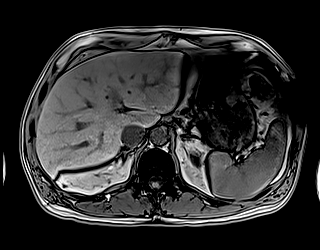
[im 88/88]
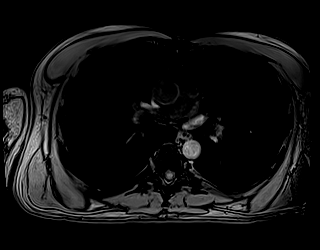

[Series 15: T1 · axial · 3.1mm · 1.25mm/px · z∈[-141,+129]mm · 3 of 88 slices shown (2 of 2)]
[im 1/88]
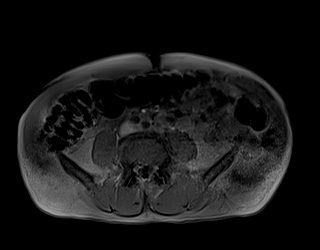
[im 44/88]
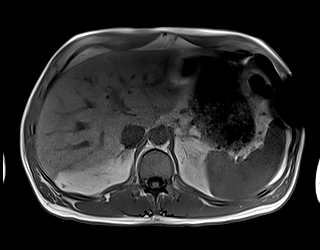
[im 88/88]
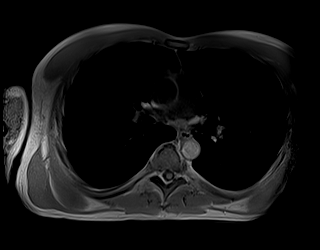

[Series 16: bSSFP · axial · 7.0mm · 1.25mm/px · 1 of 34 slices shown]
[im 1/34]
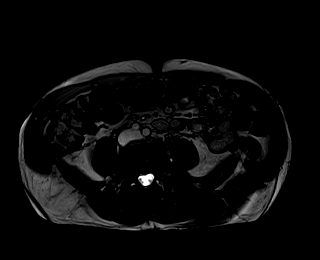

[Series 18: T1 dynamic · axial · 3.0mm · 1.25mm/px · z∈[-133,+128]mm · 3 of 88 slices shown (1 of 7)]
[im 1/88]
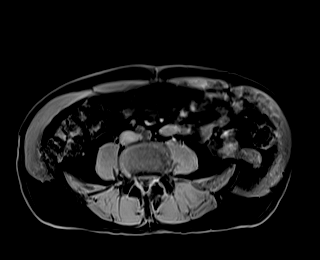
[im 44/88]
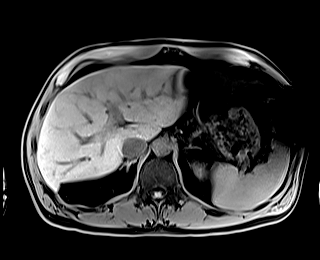
[im 88/88]
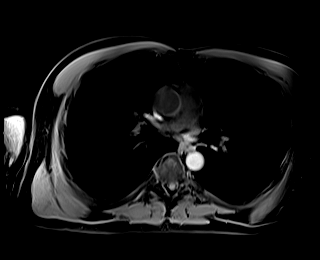

[Series 22: T1 dynamic · axial · 3.0mm · 1.25mm/px · z∈[-133,+128]mm · 3 of 88 slices shown (2 of 7)]
[im 1/88]
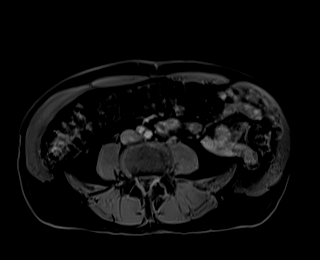
[im 44/88]
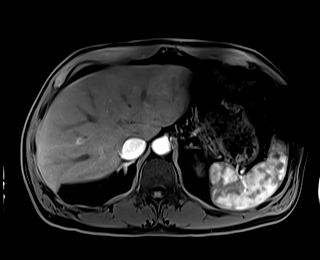
[im 88/88]
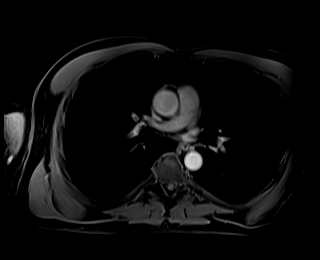

[Series 24: T1 dynamic · axial · 3.0mm · 1.25mm/px · z∈[-133,+128]mm · 3 of 88 slices shown (3 of 7)]
[im 1/88]
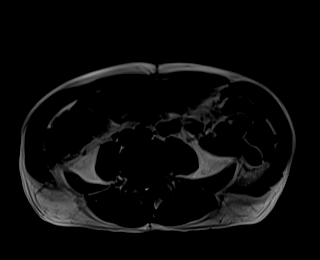
[im 44/88]
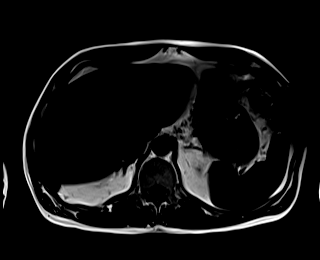
[im 88/88]
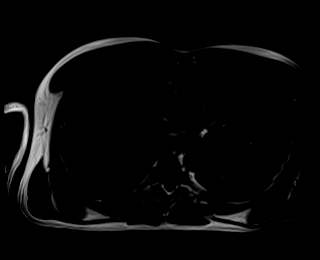

[Series 27: T1 dynamic · axial · 3.0mm · 1.25mm/px · z∈[-133,+128]mm · 3 of 88 slices shown (4 of 7)]
[im 1/88]
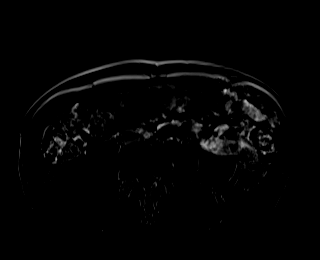
[im 44/88]
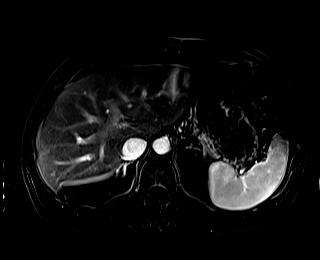
[im 88/88]
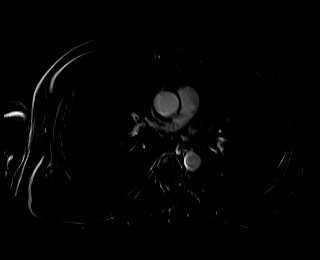

[Series 30: T1 dynamic · axial · 3.0mm · 1.25mm/px · z∈[-133,+128]mm · 3 of 88 slices shown (5 of 7)]
[im 1/88]
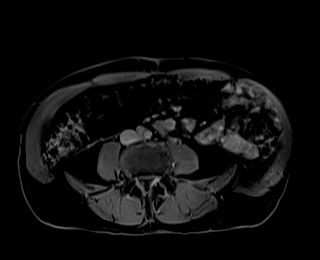
[im 44/88]
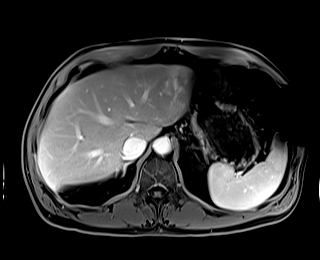
[im 88/88]
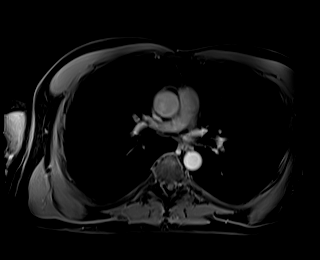

[Series 31: T1 dynamic · axial · 3.0mm · 1.25mm/px · z∈[-133,+128]mm · 3 of 88 slices shown (6 of 7)]
[im 1/88]
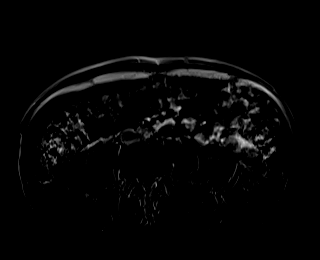
[im 44/88]
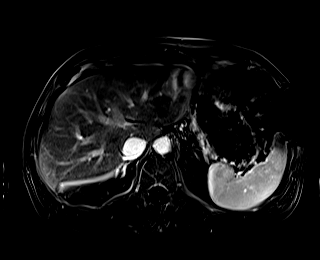
[im 88/88]
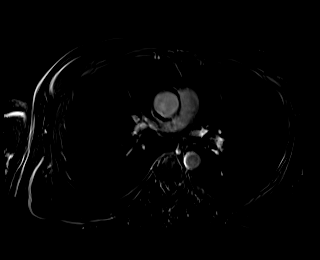

[Series 33: T1 dynamic · coronal · 5.0mm · 1.41mm/px · 2 of 52 slices shown (7 of 7)]
[im 1/52]
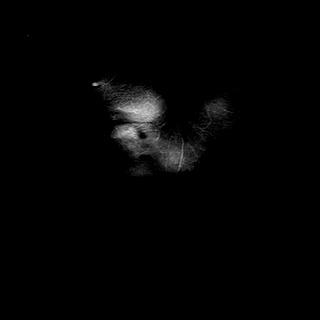
[im 52/52]
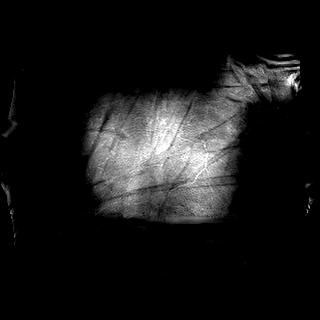

[38 of 48 positions shown; findings below may reference images not displayed]

FINDINGS: Lower chest: No acute findings.

Hepatobiliary: No hepatic steatosis. No suspicious hepatic lesion.
Gallbladder surgically absent. No biliary ductal dilation.

Pancreas: Irregularity shaped lobular large complex collection along
the anterior body of the pancreas is again difficult to accurately
measure but estimated to measure 4.9 x 2.1 x 1.6 cm on images [DATE]
and [DATE] previously 6.0 x 2.6 x 2.2 cm. This collection is once
again is predominantly T2 hyperintense and T1 hypointense without
suspicious postcontrast enhancement. Otherwise, homogeneous
enhancement of the pancreatic parenchyma. No pancreatic ductal
dilation. No evidence of acute inflammation.

Spleen: Similar borderline splenomegaly measuring 12.5 cm in maximum
dimension.

Adrenals/Urinary Tract: Bilateral adrenal glands appear normal. No
hydronephrosis. No solid enhancing renal mass.

Stomach/Bowel: Visualized portions within the abdomen are
unremarkable.

Vascular/Lymphatic: No pathologically enlarged lymph nodes
identified. No abdominal aortic aneurysm demonstrated.

Other:  None.

Musculoskeletal: No suspicious bone lesions identified.
IMPRESSION: 1. Decreased size of the complex pancreatic collection, which is not
demonstrate suspicious MRI features and is again favored to reflect
a chronic pancreatic pseudocyst. Recommend follow up pre and post
contrast MRI/MRCP 6 months versus fluid sampling by EUS/FNA. This
recommendation follows ACR consensus guidelines: Management of
Incidental Pancreatic Cysts: A White Paper of the ACR Incidental
Findings Committee. [HOSPITAL] [XI];[DATE].
2. No acute findings in the abdomen.

## 2021-04-28 MED ORDER — GADOBUTROL 1 MMOL/ML IV SOLN
8.0000 mL | Freq: Once | INTRAVENOUS | Status: AC | PRN
  Administered 2021-04-28: 8 mL via INTRAVENOUS

## 2021-10-30 ENCOUNTER — Other Ambulatory Visit: Payer: Self-pay

## 2021-10-30 DIAGNOSIS — K863 Pseudocyst of pancreas: Secondary | ICD-10-CM

## 2021-11-01 ENCOUNTER — Telehealth: Payer: Self-pay

## 2021-11-01 NOTE — Telephone Encounter (Signed)
Glad he feels well.  His wishes regarding no imaging noted.  He should feel free to reach out if things change.  Thanks

## 2021-11-01 NOTE — Telephone Encounter (Signed)
-----   Message from April H Pait sent at 11/01/2021  8:43 AM EDT ----- Regarding: FW: MR/MRCP w w/o  ----- Message ----- From: Lorenza Cambridge Sent: 10/31/2021   9:24 AM EDT To: April H Pait Subject: RE: MR/MRCP w w/o                              10/31/21 - patient declined to schedule.  States he feels better.   ----- Message ----- From: Sinda Du H Sent: 10/30/2021  12:48 PM EDT To: Lorenza Cambridge Subject: FW: MR/MRCP w w/o                               ----- Message ----- From: Chrystie Nose, RN Sent: 10/30/2021  12:39 PM EDT To: April H Pait; Marylynn Pearson Subject: MR/MRCP w w/o                                  Pt needs MR/MRCP w w/o scheduled, order in epic.

## 2021-11-01 NOTE — Telephone Encounter (Signed)
Received message from rad scheduling that pt declined to schedule MR/MRCP, states that he is feeling better and did not want to schedule scan. Dr. Marina Goodell notified.
